# Patient Record
Sex: Male | Born: 1990 | Hispanic: Yes | Smoking: Current every day smoker
Health system: Southern US, Community
[De-identification: ages and names within clinical notes are randomized; demographics above are authoritative.]

## PROBLEM LIST (undated history)

## (undated) DIAGNOSIS — K529 Noninfective gastroenteritis and colitis, unspecified: Secondary | ICD-10-CM

---
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
## 2011-07-03 ENCOUNTER — Emergency Department (HOSPITAL_COMMUNITY)
Admission: EM | Admit: 2011-07-03 | Discharge: 2011-07-04 | Disposition: A | Discharge status: Other Acute Inpatient Hospital | Payer: Self-pay | Attending: Emergency Medicine | Admitting: Emergency Medicine

## 2011-07-03 DIAGNOSIS — R45851 Suicidal ideations: Secondary | ICD-10-CM | POA: Insufficient documentation

## 2011-07-03 DIAGNOSIS — F1010 Alcohol abuse, uncomplicated: Secondary | ICD-10-CM | POA: Insufficient documentation

## 2011-07-03 DIAGNOSIS — F3289 Other specified depressive episodes: Secondary | ICD-10-CM | POA: Insufficient documentation

## 2011-07-03 DIAGNOSIS — F329 Major depressive disorder, single episode, unspecified: Secondary | ICD-10-CM | POA: Insufficient documentation

## 2011-07-03 LAB — COMPREHENSIVE METABOLIC PANEL
ALT: 32 U/L (ref 0–53)
AST: 41 U/L — ABNORMAL HIGH (ref 0–37)
Albumin: 4.5 g/dL (ref 3.5–5.2)
CO2: 25 mEq/L (ref 19–32)
Calcium: 10.1 mg/dL (ref 8.4–10.5)
Chloride: 104 mEq/L (ref 96–112)
Creatinine, Ser: 0.79 mg/dL (ref 0.50–1.35)
GFR calc non Af Amer: >90 mL/min (ref >90)
Sodium: 140 mEq/L (ref 135–145)

## 2011-07-03 LAB — DIFFERENTIAL
Basophils Relative: 0 % (ref 0–1)
Eosinophils Absolute: 0.2 10*3/uL (ref 0.0–0.7)
Eosinophils Relative: 2 % (ref 0–5)
Neutrophils Relative %: 50 % (ref 43–77)

## 2011-07-03 LAB — RAPID URINE DRUG SCREEN, HOSP PERFORMED
Amphetamines: NOT DETECTED
Barbiturates: NOT DETECTED
Benzodiazepines: NOT DETECTED
Cocaine: NOT DETECTED

## 2011-07-03 LAB — CBC
MCH: 30.3 pg (ref 26.0–34.0)
Platelets: 245 10*3/uL (ref 150–400)
RBC: 5.18 MIL/uL (ref 4.22–5.81)
RDW: 12.7 % (ref 11.5–15.5)
WBC: 7.7 10*3/uL (ref 4.0–10.5)

## 2011-07-04 ENCOUNTER — Inpatient Hospital Stay (HOSPITAL_COMMUNITY)
Admission: AD | Admit: 2011-07-04 | Discharge: 2011-07-07 | DRG: 881 | Disposition: A | Discharge status: Home / Self Care | Payer: PRIVATE HEALTH INSURANCE | Source: Ambulatory Visit | Attending: Psychiatry | Admitting: Psychiatry

## 2011-07-04 DIAGNOSIS — R45851 Suicidal ideations: Secondary | ICD-10-CM

## 2011-07-04 DIAGNOSIS — Z818 Family history of other mental and behavioral disorders: Secondary | ICD-10-CM

## 2011-07-04 DIAGNOSIS — F4321 Adjustment disorder with depressed mood: Principal | ICD-10-CM

## 2011-07-04 DIAGNOSIS — Z5900 Homelessness unspecified: Secondary | ICD-10-CM

## 2011-07-04 DIAGNOSIS — Z560 Unemployment, unspecified: Secondary | ICD-10-CM

## 2011-07-05 DIAGNOSIS — F4321 Adjustment disorder with depressed mood: Secondary | ICD-10-CM
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
## 2014-03-17 ENCOUNTER — Emergency Department (HOSPITAL_COMMUNITY)
Admission: EM | Admit: 2014-03-17 | Discharge: 2014-03-17 | Disposition: A | Discharge status: Home / Self Care | Payer: PRIVATE HEALTH INSURANCE | Attending: Emergency Medicine | Admitting: Emergency Medicine

## 2014-03-17 ENCOUNTER — Emergency Department (HOSPITAL_COMMUNITY): Payer: PRIVATE HEALTH INSURANCE

## 2014-03-17 ENCOUNTER — Encounter (HOSPITAL_COMMUNITY): Payer: Self-pay | Admitting: Emergency Medicine

## 2014-03-17 DIAGNOSIS — F17200 Nicotine dependence, unspecified, uncomplicated: Secondary | ICD-10-CM | POA: Insufficient documentation

## 2014-03-17 DIAGNOSIS — R112 Nausea with vomiting, unspecified: Secondary | ICD-10-CM

## 2014-03-17 DIAGNOSIS — K529 Noninfective gastroenteritis and colitis, unspecified: Secondary | ICD-10-CM

## 2014-03-17 DIAGNOSIS — K625 Hemorrhage of anus and rectum: Secondary | ICD-10-CM

## 2014-03-17 DIAGNOSIS — K5289 Other specified noninfective gastroenteritis and colitis: Secondary | ICD-10-CM | POA: Insufficient documentation

## 2014-03-17 LAB — URINALYSIS, ROUTINE W REFLEX MICROSCOPIC
BILIRUBIN URINE: NEGATIVE
GLUCOSE, UA: NEGATIVE mg/dL
Hgb urine dipstick: NEGATIVE
KETONES UR: NEGATIVE mg/dL
LEUKOCYTES UA: NEGATIVE
Nitrite: NEGATIVE
PH: 6 (ref 5.0–8.0)
Protein, ur: 30 mg/dL — AB
Specific Gravity, Urine: 1.031 — ABNORMAL HIGH (ref 1.005–1.030)
Urobilinogen, UA: 1 mg/dL (ref 0.0–1.0)

## 2014-03-17 LAB — COMPREHENSIVE METABOLIC PANEL
ALBUMIN: 4.2 g/dL (ref 3.5–5.2)
ALK PHOS: 107 U/L (ref 39–117)
ALT: 31 U/L (ref 0–53)
AST: 25 U/L (ref 0–37)
BILIRUBIN TOTAL: 0.7 mg/dL (ref 0.3–1.2)
BUN: 14 mg/dL (ref 6–23)
CHLORIDE: 101 meq/L (ref 96–112)
CO2: 27 mEq/L (ref 19–32)
Calcium: 9.7 mg/dL (ref 8.4–10.5)
Creatinine, Ser: 0.99 mg/dL (ref 0.50–1.35)
GFR calc Af Amer: >90 mL/min (ref >90)
GFR calc non Af Amer: >90 mL/min (ref >90)
Glucose, Bld: 93 mg/dL (ref 70–99)
POTASSIUM: 4 meq/L (ref 3.7–5.3)
SODIUM: 140 meq/L (ref 137–147)
Total Protein: 7.4 g/dL (ref 6.0–8.3)

## 2014-03-17 LAB — CBC WITH DIFFERENTIAL/PLATELET
BASOS ABS: 0 10*3/uL (ref 0.0–0.1)
BASOS PCT: 0 % (ref 0–1)
Eosinophils Absolute: 0.3 10*3/uL (ref 0.0–0.7)
Eosinophils Relative: 4 % (ref 0–5)
HCT: 44.5 % (ref 39.0–52.0)
HEMOGLOBIN: 15.8 g/dL (ref 13.0–17.0)
Lymphocytes Relative: 32 % (ref 12–46)
Lymphs Abs: 2.3 10*3/uL (ref 0.7–4.0)
MCH: 30 pg (ref 26.0–34.0)
MCHC: 35.5 g/dL (ref 30.0–36.0)
MCV: 84.4 fL (ref 78.0–100.0)
Monocytes Absolute: 0.8 10*3/uL (ref 0.1–1.0)
Monocytes Relative: 11 % (ref 3–12)
NEUTROS ABS: 3.8 10*3/uL (ref 1.7–7.7)
NEUTROS PCT: 53 % (ref 43–77)
Platelets: 227 10*3/uL (ref 150–400)
RBC: 5.27 MIL/uL (ref 4.22–5.81)
RDW: 12.5 % (ref 11.5–15.5)
WBC: 7.1 10*3/uL (ref 4.0–10.5)

## 2014-03-17 LAB — URINE MICROSCOPIC-ADD ON

## 2014-03-17 LAB — POC OCCULT BLOOD, ED: Fecal Occult Bld: POSITIVE — AB

## 2014-03-17 LAB — LIPASE, BLOOD: Lipase: 39 U/L (ref 11–59)

## 2014-03-17 MED ORDER — IOHEXOL 300 MG/ML  SOLN
50.0000 mL | Freq: Once | INTRAMUSCULAR | Status: AC | PRN
  Administered 2014-03-17: 50 mL via ORAL

## 2014-03-17 MED ORDER — ONDANSETRON 4 MG PO TBDP: ORAL_TABLET | ORAL | Status: AC

## 2014-03-17 MED ORDER — HYDROCODONE-ACETAMINOPHEN 5-325 MG PO TABS: 1.0000 | ORAL_TABLET | Freq: Four times a day (QID) | ORAL | Status: AC | PRN

## 2014-03-17 MED ORDER — SODIUM CHLORIDE 0.9 % IV BOLUS (SEPSIS)
1000.0000 mL | Freq: Once | INTRAVENOUS | Status: AC
  Administered 2014-03-17: 1000 mL via INTRAVENOUS

## 2014-03-17 MED ORDER — IOHEXOL 300 MG/ML  SOLN
100.0000 mL | Freq: Once | INTRAMUSCULAR | Status: AC | PRN
  Administered 2014-03-17: 100 mL via INTRAVENOUS

## 2014-03-17 MED ORDER — MORPHINE SULFATE 4 MG/ML IJ SOLN
4.0000 mg | Freq: Once | INTRAMUSCULAR | Status: AC
  Administered 2014-03-17: 4 mg via INTRAVENOUS
  Filled 2014-03-17: qty 1

## 2014-03-17 NOTE — ED Notes (Signed)
Pt c/o left sided chest pain and epigastric to lower abd pain. Pt states he felt bad yesterday and had two BM that was diarrhea. Today pt states he had a BM that was dark red and just blood.

## 2014-03-17 NOTE — ED Provider Notes (Signed)
CSN: [id number]     Arrival date & time 03/17/14  1424 History   First MD Initiated Contact with Patient 03/17/14 1457     Chief Complaint  Patient presents with  . Chest Pain     (Consider location/radiation/quality/duration/timing/severity/associated sxs/prior Treatment) Patient is a 23 y.o. male presenting with chest pain and hematochezia. The history is provided by the patient.  Chest Pain Pain location:  L chest Pain quality: sharp   Pain radiates to:  Epigastrium Pain severity:  Moderate Onset quality:  Sudden Timing:  Sporadic Chronicity:  New Context: at rest   Associated symptoms: abdominal pain and nausea   Associated symptoms: no cough, no fever, no shortness of breath and not vomiting   Rectal Bleeding Quality:  Bright red Amount:  Scant Timing:  Constant Progression:  Unchanged Chronicity:  New Context: not anal fissures, not anal penetration and not constipation   Similar prior episodes: no   Relieved by:  Nothing Worsened by:  Nothing tried Associated symptoms: abdominal pain   Associated symptoms: no fever and no vomiting     History reviewed. No pertinent past medical history. History reviewed. No pertinent past surgical history. No family history on file. History  Substance Use Topics  . Smoking status: Current Every Day Smoker  . Smokeless tobacco: Not on file  . Alcohol Use: No    Review of Systems  Constitutional: Negative for fever and chills.  Respiratory: Negative for cough and shortness of breath.   Cardiovascular: Positive for chest pain.  Gastrointestinal: Positive for nausea, abdominal pain, diarrhea, blood in stool and hematochezia. Negative for vomiting.  All other systems reviewed and are negative.     Allergies  Review of patient's allergies indicates no known allergies.  Home Medications   Prior to Admission medications   Not on File   BP 109/63  Pulse 91  Temp(Src) 98.3 F (36.8 C) (Oral)  Resp 13  SpO2  99% Physical Exam  Nursing note and vitals reviewed. Constitutional: He is oriented to person, place, and time. He appears well-developed and well-nourished. No distress.  HENT:  Head: Normocephalic and atraumatic.  Mouth/Throat: Oropharynx is clear and moist. No oropharyngeal exudate.  Eyes: EOM are normal. Pupils are equal, round, and reactive to light.  Neck: Normal range of motion. Neck supple.  Cardiovascular: Normal rate and regular rhythm.  Exam reveals no friction rub.   No murmur heard. Pulmonary/Chest: Effort normal and breath sounds normal. No respiratory distress. He has no wheezes. He has no rales. He exhibits tenderness (L sided).  Abdominal: Soft. He exhibits no distension. There is tenderness (epigastric, LLQ). There is no rebound.  Musculoskeletal: Normal range of motion. He exhibits no edema.  Neurological: He is alert and oriented to person, place, and time.  Skin: No rash noted. He is not diaphoretic.    ED Course  Procedures (including critical care time) Labs Review Labs Reviewed  CBC WITH DIFFERENTIAL  COMPREHENSIVE METABOLIC PANEL  LIPASE, BLOOD  URINALYSIS, ROUTINE W REFLEX MICROSCOPIC    Imaging Review Ct Abdomen Pelvis W Contrast  03/17/2014   CLINICAL DATA:  23 year old with epigastric and left abdominal pain.  EXAM: CT ABDOMEN AND PELVIS WITH CONTRAST  TECHNIQUE: Multidetector CT imaging of the abdomen and pelvis was performed using the standard protocol following bolus administration of intravenous contrast.  CONTRAST:  100mL OMNIPAQUE IOHEXOL 300 MG/ML  SOLN  COMPARISON:  None.  FINDINGS: Lung bases are clear. No evidence for free air. Normal appearance of the liver, gallbladder  and portal venous system. Normal appearance of the spleen, adrenal glands and both kidneys. Normal appearance of the pancreas. No significant free fluid. There are small lymph nodes in the retroperitoneum. No gross abnormality to the prostate or urinary bladder. No gross  abnormality to the colon. Appendix is not clearly identified.  There is mild distention of the proximal small bowel loops containing oral contrast. There is a short segment of proximal small bowel with asymmetric wall thickening. This is best seen on sequence 2, image 51. The wall thickening measures up to 9 mm. On sequence 2, image 56, there appears to be in intraluminal lesion or submucosal lesion which is occluding or narrowing the lumen. The Hounsfield units of this low-density material roughly measures 40 and not consistent with simple fluid. Remainder of the small bowel appears normal.  No acute bone abnormality.  There is disc space narrowing at L5-S1.  IMPRESSION: Abnormal segment of small bowel in the left abdomen. There is asymmetric wall thickening in this area and concern for submucosal or intraluminal lesion(s). These findings are nonspecific. Based on the patient's age, favor an infectious or inflammatory process. Neoplasm cannot be excluded. Recommend GI consultation.  Mild dilatation of the proximal small bowel loops. An early or partial obstruction cannot be completely excluded.  These results were called by telephone at the time of interpretation on 03/17/2014 at 4:44 PM to Dr. Marena ChancyWILLIAM WALDEN , who verbally acknowledged these results.   Electronically Signed   By: Richarda OverlieAdam  Henn M.D.   On: 03/17/2014 16:47     EKG Interpretation   Date/Time:  Friday March 17 2014 14:35:59 EDT Ventricular Rate:  91 PR Interval:  138 QRS Duration: 91 QT Interval:  345 QTC Calculation: 424 R Axis:   53 Text Interpretation:  Sinus rhythm ST elev, probable normal early repol  pattern No reciprocal changes No old for comparison Confirmed by WARD,   DO, KRISTEN ([id number]) on 03/17/2014 2:39:59 PM      MDM   Final diagnoses:  Non-intractable vomiting with nausea, vomiting of unspecified type  Colitis  Rectal bleed    39M presents with myalgias, chills, epigastric pain, L sided chest pain, LLQ pain, bloody  stools. All began yesterday. Had some diarrhea, then had one bloody stools today - all blood. No hx of constipation. No fevers. Chest pain is left sided, hurts to touch, described as sharp radiating into the abdomen. AFVSS here. L sided chest tenderness, epigastric & LLQ tenderness on exam. Rectal exam with soft external hemorrhoids, no gross blood on exam, but stool is hemoccult positive. Concern for possible diverticulitis - but is young, has hx of being in prison, and has no hx of constipation, so I will CT scan him. EKG ok, CP described as sharp, doubt ACS. I spoke with Radiology about the CT scan - it shows asymmetric bowel wall thickening - likely infectious, but cannot rule out neoplasm. Also stated possible early PSBO.  I spoke with GI, Shanda BumpsJessica, Dr. Marvell FullerGessner's PA, who discussed the patient with Dr. Leone PayorGessner. He felt likely infectious, and if looking well could go home. Patient here stating he's feeling better. He told me he's much better today than compared with yesterday. He's eating perfectly fine today. Doubt obstruction or early obstruction. Given small amount of pain meds, some zofran, and GI f/u. Stable for discharge.  Dagmar HaitWilliam Blair Walden, MD 03/17/14 [phone]

## 2014-03-17 NOTE — Discharge Instructions (Signed)

## 2014-03-17 NOTE — Progress Notes (Signed)
[facility] provided pt with a list of primary care resources and a [facility] [place] Card application to help patient establish primary care.  °

## 2014-03-21 ENCOUNTER — Encounter: Payer: Self-pay | Admitting: Gastroenterology

## 2014-03-22 ENCOUNTER — Encounter (HOSPITAL_COMMUNITY): Payer: Self-pay | Admitting: Emergency Medicine

## 2014-03-22 ENCOUNTER — Emergency Department (HOSPITAL_COMMUNITY): Payer: PRIVATE HEALTH INSURANCE

## 2014-03-22 ENCOUNTER — Emergency Department (HOSPITAL_COMMUNITY)
Admission: EM | Admit: 2014-03-22 | Discharge: 2014-03-22 | Disposition: A | Discharge status: Home / Self Care | Payer: PRIVATE HEALTH INSURANCE | Attending: Emergency Medicine | Admitting: Emergency Medicine

## 2014-03-22 DIAGNOSIS — R42 Dizziness and giddiness: Secondary | ICD-10-CM | POA: Insufficient documentation

## 2014-03-22 DIAGNOSIS — K529 Noninfective gastroenteritis and colitis, unspecified: Secondary | ICD-10-CM

## 2014-03-22 DIAGNOSIS — R110 Nausea: Secondary | ICD-10-CM

## 2014-03-22 DIAGNOSIS — R5383 Other fatigue: Secondary | ICD-10-CM

## 2014-03-22 DIAGNOSIS — K5289 Other specified noninfective gastroenteritis and colitis: Secondary | ICD-10-CM | POA: Insufficient documentation

## 2014-03-22 DIAGNOSIS — R61 Generalized hyperhidrosis: Secondary | ICD-10-CM | POA: Insufficient documentation

## 2014-03-22 DIAGNOSIS — F17200 Nicotine dependence, unspecified, uncomplicated: Secondary | ICD-10-CM | POA: Insufficient documentation

## 2014-03-22 DIAGNOSIS — R5381 Other malaise: Secondary | ICD-10-CM | POA: Insufficient documentation

## 2014-03-22 HISTORY — DX: Noninfective gastroenteritis and colitis, unspecified: K52.9

## 2014-03-22 LAB — COMPREHENSIVE METABOLIC PANEL
ALBUMIN: 4.3 g/dL (ref 3.5–5.2)
ALK PHOS: 106 U/L (ref 39–117)
ALT: 25 U/L (ref 0–53)
AST: 21 U/L (ref 0–37)
BUN: 18 mg/dL (ref 6–23)
CO2: 24 mEq/L (ref 19–32)
Calcium: 9.7 mg/dL (ref 8.4–10.5)
Chloride: 103 mEq/L (ref 96–112)
Creatinine, Ser: 1.03 mg/dL (ref 0.50–1.35)
GFR calc non Af Amer: >90 mL/min (ref >90)
GLUCOSE: 101 mg/dL — AB (ref 70–99)
POTASSIUM: 4.5 meq/L (ref 3.7–5.3)
SODIUM: 141 meq/L (ref 137–147)
TOTAL PROTEIN: 7.1 g/dL (ref 6.0–8.3)
Total Bilirubin: 1.1 mg/dL (ref 0.3–1.2)

## 2014-03-22 LAB — CBC
HCT: 44.7 % (ref 39.0–52.0)
HEMOGLOBIN: 15.9 g/dL (ref 13.0–17.0)
MCH: 29.8 pg (ref 26.0–34.0)
MCHC: 35.6 g/dL (ref 30.0–36.0)
MCV: 83.9 fL (ref 78.0–100.0)
PLATELETS: 255 10*3/uL (ref 150–400)
RBC: 5.33 MIL/uL (ref 4.22–5.81)
RDW: 12.2 % (ref 11.5–15.5)
WBC: 8.2 10*3/uL (ref 4.0–10.5)

## 2014-03-22 LAB — POC OCCULT BLOOD, ED: FECAL OCCULT BLD: POSITIVE — AB

## 2014-03-22 MED ORDER — ONDANSETRON HCL 4 MG PO TABS: 4.0000 mg | ORAL_TABLET | Freq: Three times a day (TID) | ORAL | Status: AC | PRN

## 2014-03-22 MED ORDER — HYDROMORPHONE HCL PF 1 MG/ML IJ SOLN
1.0000 mg | Freq: Once | INTRAMUSCULAR | Status: AC
  Administered 2014-03-22: 1 mg via INTRAVENOUS
  Filled 2014-03-22: qty 1

## 2014-03-22 MED ORDER — ONDANSETRON HCL 4 MG/2ML IJ SOLN
4.0000 mg | Freq: Once | INTRAMUSCULAR | Status: AC
  Administered 2014-03-22: 4 mg via INTRAVENOUS
  Filled 2014-03-22: qty 2

## 2014-03-22 MED ORDER — HYDROCODONE-ACETAMINOPHEN 5-325 MG PO TABS: 1.0000 | ORAL_TABLET | Freq: Four times a day (QID) | ORAL | Status: AC | PRN

## 2014-03-22 MED ORDER — SODIUM CHLORIDE 0.9 % IV BOLUS (SEPSIS)
500.0000 mL | Freq: Once | INTRAVENOUS | Status: AC
  Administered 2014-03-22: 500 mL via INTRAVENOUS

## 2014-03-22 NOTE — ED Notes (Signed)
Pt c/o increased rectal bleeding and LUQ abdominal pain x 5 days.  Pain score 7/10.  [facility] and diagnosed w/ colitis.  Sts the GI specialist can not see him for more than 30 days.  Reports he has completed all prescriptions.

## 2014-03-22 NOTE — Discharge Instructions (Signed)

## 2014-03-22 NOTE — ED Provider Notes (Signed)
CSN: [id number]     Arrival date & time 03/22/14  1355 History   First MD Initiated Contact with Patient 03/22/14 1459     Chief Complaint  Patient presents with  . Rectal Bleeding   HPI Comments: Patient was seen here on 03/17/14 for same pain.  At this time the patient was diagnosed with Colitis with nausea and vomiting.  CT scan as read by radiology appeared to favor inflammatory vs. Infectious colitis.  Patient was given hydrocodone and zofran from the ED and was told to follow-up with GI.  Patient called and made an appointment but cannot be seen by them until July 24th.    Patient is a 23 y.o. male presenting with hematochezia. The history is provided by the patient. No language interpreter was used.  Rectal Bleeding Quality:  Bright red (with half dollar sized blood clots) Amount:  Moderate Duration:  6 days Timing:  Intermittent Progression:  Unchanged Chronicity:  Recurrent Context: defecation, hemorrhoids and rectal pain   Context: not anal fissures, not anal penetration, not constipation, not diarrhea, not foreign body, not rectal injury and not spontaneously   Pain details:    Quality:  Sharp and stabbing   Severity:  Severe   Duration:  6 days   Timing:  Only with defecation   Progression:  Worsening Similar prior episodes: yes   Relieved by:  Nothing Worsened by:  Defecation Ineffective treatments:  None tried Associated symptoms: abdominal pain and light-headedness   Associated symptoms: no dizziness, no epistaxis, no fever, no hematemesis, no loss of consciousness, no recent illness and no vomiting   Abdominal pain:    Location:  LUQ and LLQ   Quality:  Stabbing and sharp   Severity:  Severe   Onset quality:  Gradual   Duration:  6 days   Timing:  Intermittent   Progression:  Worsening   Chronicity:  Recurrent Risk factors: no anticoagulant use, no hx of colorectal cancer, no hx of colorectal surgery, no hx of IBD, no liver disease, no NSAID use and no steroid  use     Past Medical History  Diagnosis Date  . Colitis    History reviewed. No pertinent past surgical history. History reviewed. No pertinent family history. History  Substance Use Topics  . Smoking status: Current Every Day Smoker -- 1.50 packs/day    Types: Cigarettes  . Smokeless tobacco: Never Used  . Alcohol Use: No    Review of Systems  Constitutional: Positive for diaphoresis and fatigue. Negative for fever, chills, activity change, appetite change and unexpected weight change.  HENT: Negative for nosebleeds.   Respiratory: Negative for chest tightness and shortness of breath.   Cardiovascular: Negative for chest pain, palpitations and leg swelling.  Gastrointestinal: Positive for nausea, abdominal pain, blood in stool, hematochezia and rectal pain. Negative for vomiting, diarrhea, constipation, abdominal distention and hematemesis.  Genitourinary: Negative for dysuria, urgency, frequency, hematuria, flank pain and difficulty urinating.  Musculoskeletal: Positive for myalgias. Negative for back pain and joint swelling.  Neurological: Positive for weakness and light-headedness. Negative for dizziness, tremors, seizures, loss of consciousness, numbness and headaches.  Psychiatric/Behavioral: Negative for confusion.  All other systems reviewed and are negative.     Allergies  Review of patient's allergies indicates no known allergies.  Home Medications   Prior to Admission medications   Medication Sig Start Date End Date Taking? Authorizing Provider  HYDROcodone-acetaminophen (NORCO/VICODIN) 5-325 MG per tablet Take 1 tablet by mouth every 6 (six) hours as needed  for moderate pain. 03/17/14  Yes Dagmar HaitWilliam Blair Walden, MD  ondansetron (ZOFRAN ODT) 4 MG disintegrating tablet 4mg  ODT q6 hours prn nausea/vomit 03/17/14  Yes Dagmar HaitWilliam Blair Walden, MD  HYDROcodone-acetaminophen (NORCO/VICODIN) 5-325 MG per tablet Take 1 tablet by mouth every 6 (six) hours as needed for moderate  pain or severe pain. 03/22/14   Courtney A Forucci, PA-C  ondansetron (ZOFRAN) 4 MG tablet Take 1 tablet (4 mg total) by mouth every 8 (eight) hours as needed for nausea or vomiting. 03/22/14   Courtney A Forucci, PA-C   BP 119/65  Pulse 81  Temp(Src) 99 F (37.2 C) (Oral)  Resp 16  SpO2 98% Physical Exam  Nursing note and vitals reviewed. Constitutional: He is oriented to person, place, and time. He appears well-developed and well-nourished. No distress.  HENT:  Head: Normocephalic and atraumatic.  Nose: Nose normal.  Mouth/Throat: Oropharynx is clear and moist. No oropharyngeal exudate.  Eyes: Conjunctivae are normal. Pupils are equal, round, and reactive to light. No scleral icterus.  Neck: Normal range of motion. Neck supple. No thyromegaly present.  Cardiovascular: Normal rate, regular rhythm, normal heart sounds and intact distal pulses.  Exam reveals no gallop and no friction rub.   No murmur heard. Pulmonary/Chest: Effort normal and breath sounds normal. No respiratory distress. He has no wheezes. He has no rales. He exhibits no tenderness.  Abdominal: Soft. Normal appearance and bowel sounds are normal. There is generalized tenderness. There is guarding. There is no rigidity, no rebound, no CVA tenderness, no tenderness at McBurney's point and negative Murphy's sign. No hernia.  Voluntary guarding of the LUQ and LLQ.    Genitourinary: Rectal exam shows external hemorrhoid. Rectal exam shows no fissure and no tenderness. Guaiac positive stool. Prostate is not enlarged and not tender.  Musculoskeletal: Normal range of motion. He exhibits no edema and no tenderness.  Lymphadenopathy:    He has no cervical adenopathy.  Neurological: He is alert and oriented to person, place, and time.  Skin: Skin is warm and dry. He is not diaphoretic.  Psychiatric: He has a normal mood and affect. His behavior is normal. Judgment and thought content normal.    ED Course  Procedures (including  critical care time) Labs Review Labs Reviewed  COMPREHENSIVE METABOLIC PANEL - Abnormal; Notable for the following:    Glucose, Bld 101 (*)    All other components within normal limits  POC OCCULT BLOOD, ED - Abnormal; Notable for the following:    Fecal Occult Bld POSITIVE (*)    All other components within normal limits  CBC  OCCULT BLOOD X 1 CARD TO LAB, STOOL    Imaging Review Dg Abd 2 Views  03/22/2014   CLINICAL DATA:  Abdominal pain  EXAM: ABDOMEN - 2 VIEW  COMPARISON:  03/17/2014  FINDINGS: Scattered large and small bowel gas is noted. The degree of small bowel dilatation seen on the previous CT examination is not well visualized on this study. Fecal material is noted throughout the colon. No free air is seen.  IMPRESSION: Nonspecific abdomen.   Electronically Signed   By: Alcide CleverMark  Lukens M.D.   On: 03/22/2014 15:53     EKG Interpretation None      MDM   Final diagnoses:  Colitis  Nausea   Patient presents to the ED with continued rectal bleeding and abdominal pain at this time.  Patient was seen here on 03/17/14 and was diagnosed with colitis.  Patient has tried to get into [facility]  and cannot be seen until 04/21/14.  I have repeated CBC and CMP today which are unremarkable and have also performed a plain film abdominal xray to check for bowel obstruction and perforation.  Xrays were unremarkable as well.  There are no signs of infection at this time.  I agree that the patient may need further workup with GI.  I have advised him to call them today and try to get on the cancellation list and also to let them know he was again seen in the ED.  Patient was treated here with some fluids and dilaudid with good relief of pain.  I will prescribe 3 days of hydrocodone and also zofran.  I have urged him to follow-up with a PCP from the resource list which I am printing off for him here today.  He states agreement with the above plan.  I am finding no peritoneal signs on exam today, there is  no evidence of infection or bowel obstruction at this time.  Patient is stable for discharge.  He was given strict return precautions of inability to pass gas, worsening abdominal pain, chest pain, or shortness of breath.  He agrees with the above plan, and is aware that he will likely have some continued rectal bleeding.  He was also told to drink plenty of fluids and eat fibrous foods while taking Norco as this increases risk for constipation.  Dr. Criss AlvineGoldston is aware of the above plan and agrees.    Clydie Braunourtney A Forucci, PA-C 03/22/14 1645

## 2014-03-23 NOTE — ED Provider Notes (Signed)
Medical screening examination/treatment/procedure(s) were performed by non-physician practitioner and as supervising physician I was immediately available for consultation/collaboration.   EKG Interpretation None        Audree CamelScott T Goldston, MD 03/23/14 1517

## 2014-04-04 ENCOUNTER — Ambulatory Visit: Payer: Self-pay | Admitting: Gastroenterology

## 2015-05-02 IMAGING — CT CT ABD-PELV W/ CM
1 of 2 series · 15 of 32 positions shown, 19 images · IV contrast (OMNIPAQUE 300)
Comparison: None.

CLINICAL DATA: 23-year-old with epigastric and left abdominal pain.

EXAM:
CT ABDOMEN AND PELVIS WITH CONTRAST
TECHNIQUE: Multidetector CT imaging of the abdomen and pelvis was performed
using the standard protocol following bolus administration of
intravenous contrast.
CONTRAST:  100mL OMNIPAQUE IOHEXOL 300 MG/ML  SOLN

[Series 2: abd/pel with · axial · 0.72mm/px · z∈[-362,+54]mm · 15 of 91 slices shown, 19 images]
[im 4/91  soft-tissue]
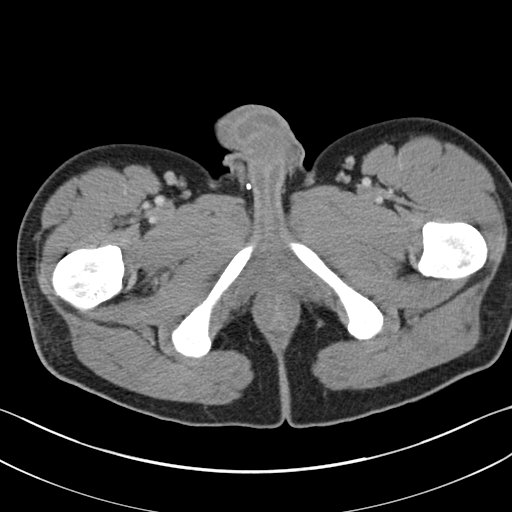
[im 4/91  bone]
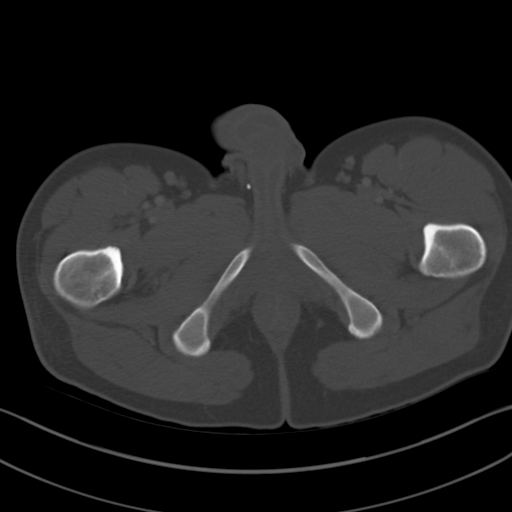
[im 12/91  soft-tissue]
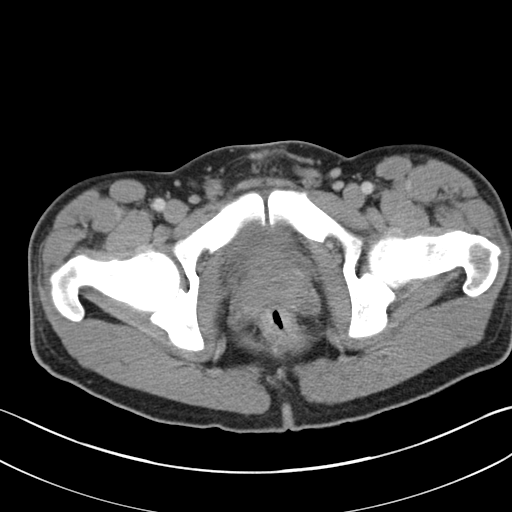
[im 19/91  soft-tissue]
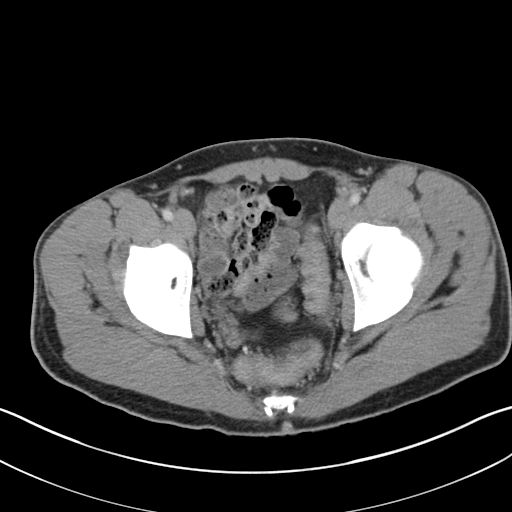
[im 27/91  soft-tissue]
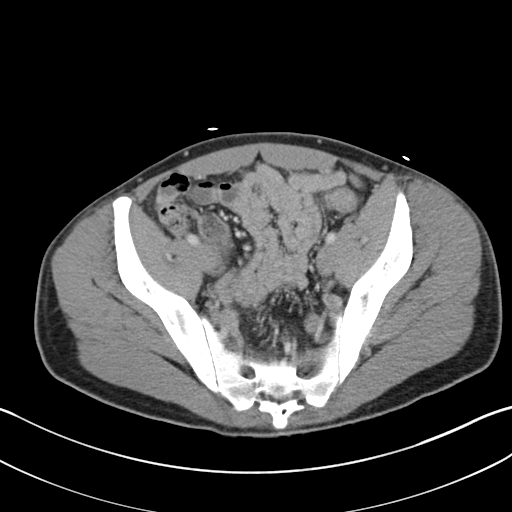
[im 31/91  soft-tissue]
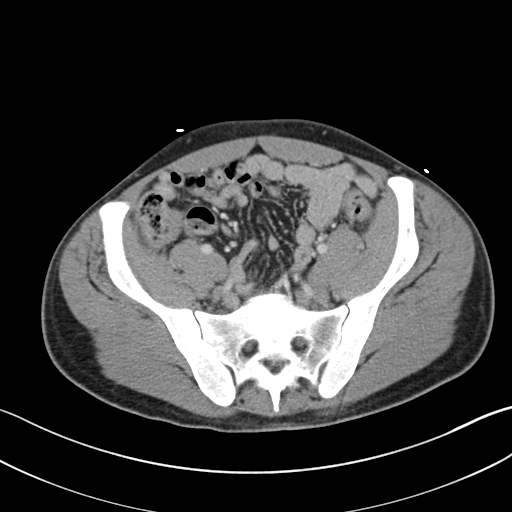
[im 38/91  soft-tissue]
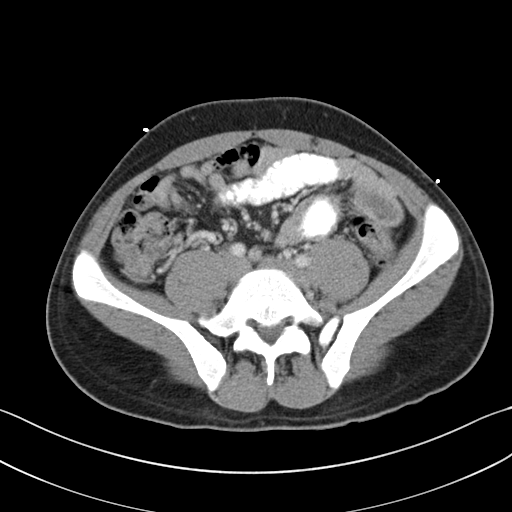
[im 46/91  soft-tissue]
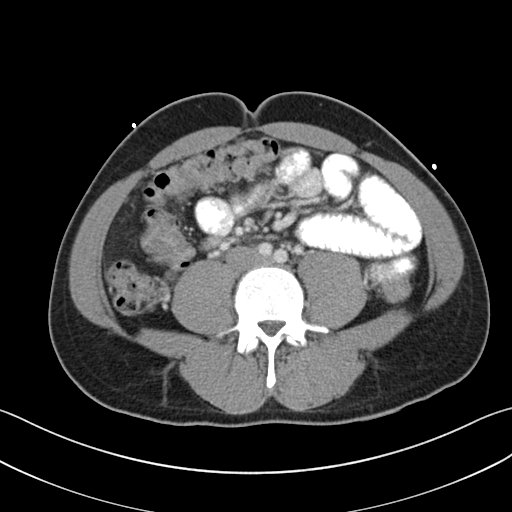
[im 53/91  soft-tissue]
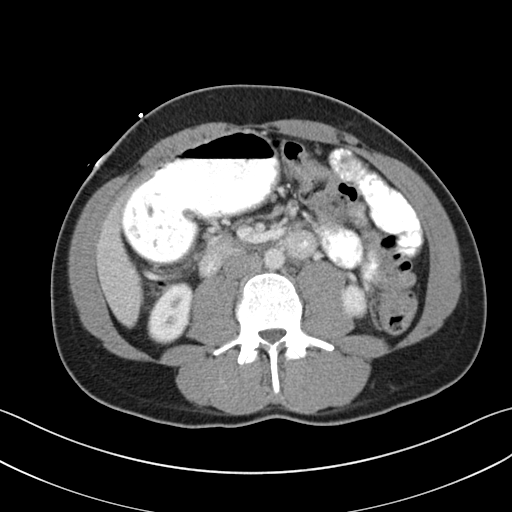
[im 61/91  soft-tissue]
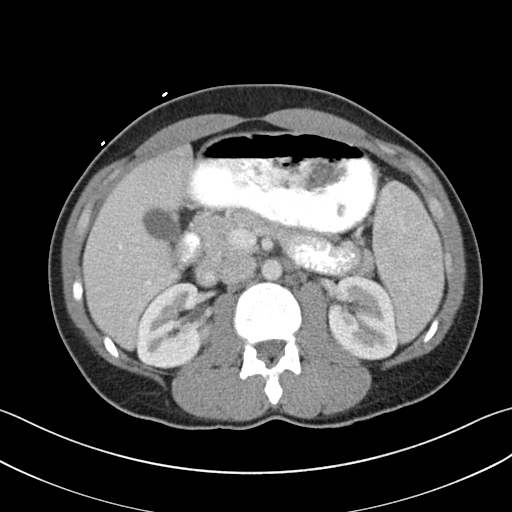
[im 61/91  bone]
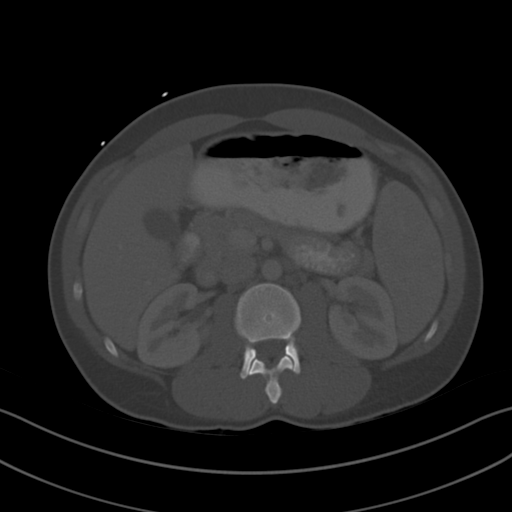
[im 64/91  soft-tissue]
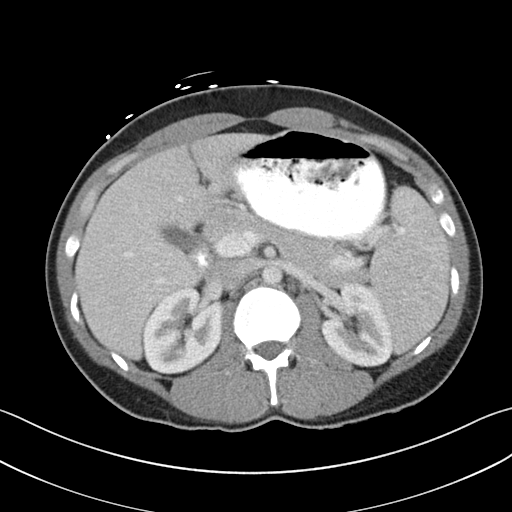
[im 72/91  soft-tissue]
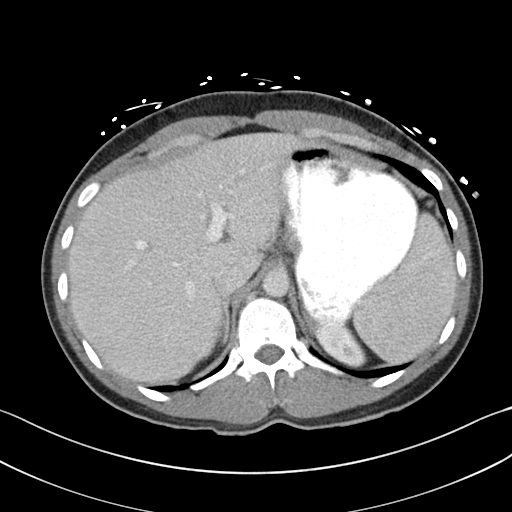
[im 76/91  lung]
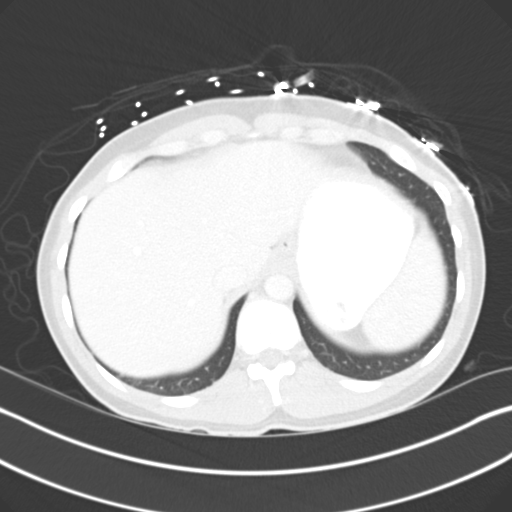
[im 79/91  soft-tissue]
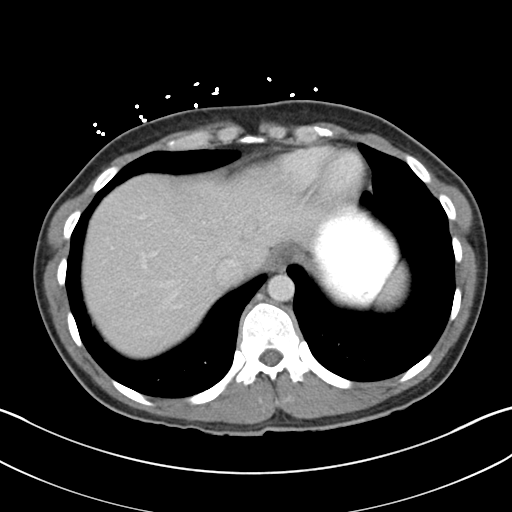
[im 79/91  lung]
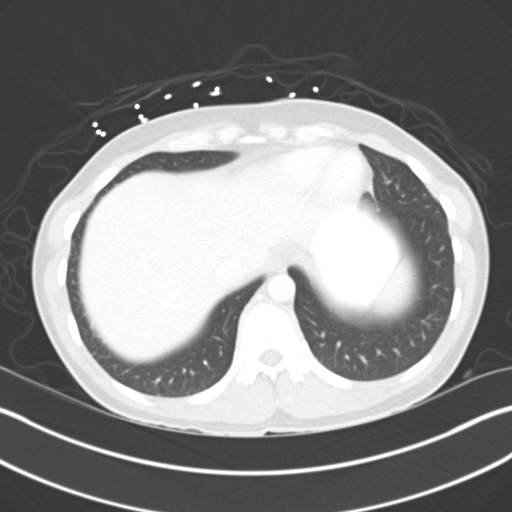
[im 83/91  lung]
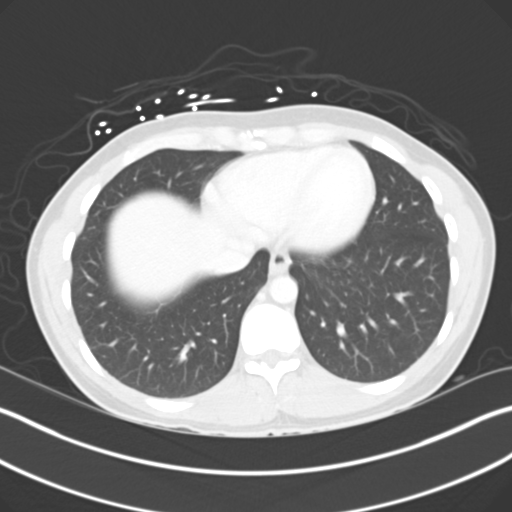
[im 87/91  soft-tissue]
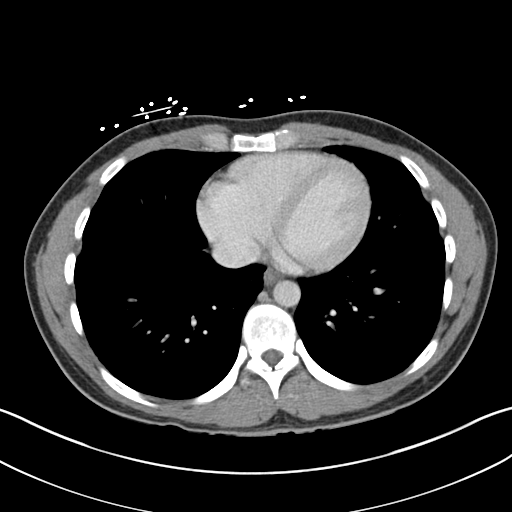
[im 87/91  lung]
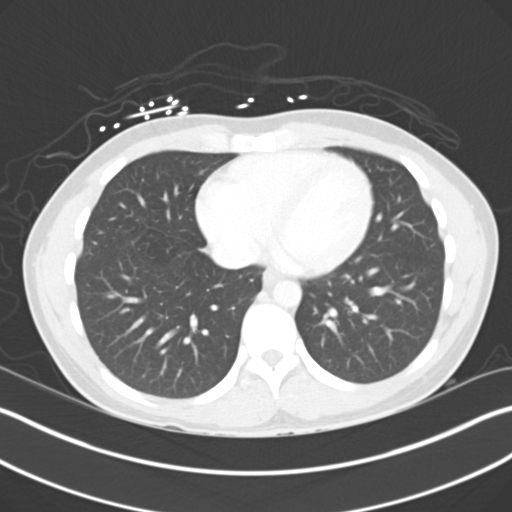

[15 of 32 positions shown; findings below may reference images not displayed]

FINDINGS: Lung bases are clear. No evidence for free air. Normal appearance of
the liver, gallbladder and portal venous system. Normal appearance
of the spleen, adrenal glands and both kidneys. Normal appearance of
the pancreas. No significant free fluid. There are small lymph nodes
in the retroperitoneum. No gross abnormality to the prostate or
urinary bladder. No gross abnormality to the colon. Appendix is not
clearly identified.

There is mild distention of the proximal small bowel loops
containing oral contrast. There is a short segment of proximal small
bowel with asymmetric wall thickening. This is best seen on sequence
2, image 51. The wall thickening measures up to 9 mm. On sequence 2,
image 56, there appears to be in intraluminal lesion or submucosal
lesion which is occluding or narrowing the lumen. The Hounsfield
units of this low-density material roughly measures 40 and not
consistent with simple fluid. Remainder of the small bowel appears
normal.

No acute bone abnormality.  There is disc space narrowing at L5-S1.
IMPRESSION: Abnormal segment of small bowel in the left abdomen. There is
asymmetric wall thickening in this area and concern for submucosal
or intraluminal lesion(s). These findings are nonspecific. Based on
the patient's age, favor an infectious or inflammatory process.
Neoplasm cannot be excluded. Recommend GI consultation.

Mild dilatation of the proximal small bowel loops. An early or
partial obstruction cannot be completely excluded.

These results were called by telephone at the time of interpretation
on 03/17/2014 at [DATE] to Dr. MARIA HELENA KAR , who verbally
acknowledged these results.
